# Patient Record
Sex: Female | Born: 1970 | Race: White | Hispanic: No | Marital: Single | Smoking: Former smoker
Health system: Southern US, Community
[De-identification: ages and names within clinical notes are randomized; demographics above are authoritative.]

## PROBLEM LIST (undated history)

## (undated) DIAGNOSIS — E119 Type 2 diabetes mellitus without complications: Secondary | ICD-10-CM

## (undated) DIAGNOSIS — D219 Benign neoplasm of connective and other soft tissue, unspecified: Secondary | ICD-10-CM

## (undated) HISTORY — PX: CHOLECYSTECTOMY: SHX55

---

## 2003-07-02 HISTORY — PX: MYOMECTOMY: SHX85

## 2014-07-21 ENCOUNTER — Other Ambulatory Visit: Payer: Self-pay | Admitting: Obstetrics and Gynecology

## 2014-07-21 DIAGNOSIS — D259 Leiomyoma of uterus, unspecified: Secondary | ICD-10-CM

## 2014-07-27 ENCOUNTER — Ambulatory Visit
Admission: RE | Admit: 2014-07-27 | Discharge: 2014-07-27 | Disposition: A | Discharge status: Home / Self Care | Payer: BLUE CROSS/BLUE SHIELD | Source: Ambulatory Visit | Attending: Obstetrics and Gynecology | Admitting: Obstetrics and Gynecology

## 2014-07-27 DIAGNOSIS — D259 Leiomyoma of uterus, unspecified: Secondary | ICD-10-CM

## 2014-07-27 HISTORY — DX: Type 2 diabetes mellitus without complications: E11.9

## 2014-07-27 HISTORY — PX: IR GENERIC HISTORICAL: IMG1180011

## 2014-07-27 HISTORY — DX: Benign neoplasm of connective and other soft tissue, unspecified: D21.9

## 2014-07-27 NOTE — Consult Note (Signed)
Chief Complaint: Chief Complaint  Patient presents with  . Advice Only    Consult for [place]      Referring Physician(s): Cousins,Sheronette A  History of Present Illness: Lorraine Barnes is a 44 y.o. female with menorrhagia and irregular cycles. The patient has known uterine fibroids and had a previous myomectomy approximately 10 years ago. Following the myomectomy, the menstrual bleeding decreased but this has returned over the past few years. The patient's menstrual bleeding is very irregular and can occur more than once a month. She says that the bleeding usually lasts at least 7 days with 4 very heavy days of bleeding. Patient is mildly anemic. In addition to the heavy menstrual bleeding and irregular bleeding, the patient complains of pelvic pressure and constant urinary frequency and urinary urgency. Patient also complains of some back and abdominal pain. Patient has 2 family members who underwent uterine artery embolization procedure with good results. The patient has discussed hysterectomy with her doctors in the past but does not want to have a hysterectomy at this time due to the recovery time and the fact that she is a single parent. Pregnancy history is G1 P1.  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  Patient is a single parent and works in Therapist, art.  Past Medical History  Diagnosis Date  . Fibroids   . Diabetes mellitus without complication     borderline.  Currently controlled by diet    Past Surgical History  Procedure Laterality Date  . Myomectomy  2005  . Cholecystectomy      Laproscopic  . Cesarean section  2007    Allergies: Review of patient's allergies indicates no known allergies.  Medications: Prior to Admission medications   Medication Sig Start Date End Date Taking? Authorizing Provider  ergocalciferol (VITAMIN D2) 50000 UNITS capsule  Take 50,000 Units by mouth once a week.   Yes Historical Provider, MD  ferrous sulfate 325 (65 FE) MG tablet Take 325 mg by mouth 2 (two) times daily with a meal.   Yes Historical Provider, MD  folic acid (FOLVITE) 1 MG tablet Take 1 mg by mouth daily.   Yes Historical Provider, MD  ibuprofen (ADVIL,MOTRIN) 800 MG tablet Take 800 mg by mouth every 6 (six) hours as needed.   Yes Historical Provider, MD    No family history on file.  History   Social History  . Marital Status: Single    Spouse Name: N/A    Number of Children: N/A  . Years of Education: N/A   Social History Main Topics  . Smoking status: Former Smoker -- 0.25 packs/day    Types: Cigarettes    Start date: 07/28/1987    Quit date: 07/27/1992  . Smokeless tobacco: Never Used  . Alcohol Use: No  . Drug Use: No  . Sexual Activity: None   Other Topics Concern  . None   Social History Narrative  . None    Review of Systems  Constitutional: Negative.   HENT:       Ringing in ears and sinus infections.  Eyes:       Blurred vision  Respiratory: Negative.   Cardiovascular: Negative.   Gastrointestinal: Negative.   Endocrine: Negative.   Genitourinary: Positive for menstrual problem and pelvic pain.       Urinary urgency and frequency  Musculoskeletal: Positive for back pain.  Skin: Negative.   Allergic/Immunologic: Negative.   Neurological: Positive for  light-headedness and headaches.  Psychiatric/Behavioral: Negative.     Vital Signs: BP 125/86 mmHg  Pulse 70  Temp(Src) 98.1 F (36.7 C) (Oral)  Resp 14  Ht 5\' 3"  (1.6 m)  Wt 300 lb (136.079 kg)  BMI 53.16 kg/m2  SpO2 96%  LMP 07/14/2014 (Exact Date)  Physical Exam  Constitutional:  Morbid obesity  Cardiovascular: Normal rate, regular rhythm and normal heart sounds.   Difficult to palpable groin or ankle pulses due to obesity.  Pulmonary/Chest: Effort normal and breath sounds normal.  Abdominal: Soft.    Imaging: Ultrasound from 05/06/2014  demonstrated multiple uterine fibroids. Posterior to the uterus and slightly to the left are 2 simple cystic areas which could represent ovarian cysts. Labs:  CBC: No results for input(s): WBC, HGB, HCT, PLT in the last 8760 hours.  COAGS: No results for input(s): INR, APTT in the last 8760 hours.  BMP: No results for input(s): NA, K, CL, CO2, GLUCOSE, BUN, CALCIUM, CREATININE, GFRNONAA, GFRAA in the last 8760 hours.  Invalid input(s): CMP  LIVER FUNCTION TESTS: No results for input(s): BILITOT, AST, ALT, ALKPHOS, PROT, ALBUMIN in the last 8760 hours.  TUMOR MARKERS: No results for input(s): AFPTM, CEA, CA199, CHROMGRNA in the last 8760 hours.  Assessment and Plan: 44 year old female with menorrhagia and irregular menstrual bleeding. The patient has known fibroids based on ultrasound and previous myomectomy. The patient had temporary relief of her menorrhagia following a myomectomy approximately 10 years ago. The menorrhagia has returned and the patient now has bulk symptoms.   The patient's symptoms are typical for uterine fibroids. We discussed the different treatment options for fibroids, including hysterectomy and uterine artery embolization. I discussed uterine artery embolization procedure in depth including the risks and benefits. The risks included bleeding, infection and vascular injury. I has explained to the patient that she would require a night in the hospital for management of post procedure symptoms. She is very interested in the uterine artery embolization and would like to pursue additional workup to see if she is a candidate.  We will schedule the patient for a pelvic MRI with contrast to further evaluate her fibroid disease. Based on the inter-period bleeding, I think the patient may need an endometrial biopsy. However, the patient has had recent biopsies by her gynecologist and we will ask Dr. Garwin Brothers if the previous biopsies and workup are sufficient or if she needs an  endometrial biopsy as well.    Plan to contact the patient following the MRI results.   Thank you for this interesting consult.  I greatly enjoyed meeting Janera Peugh and look forward to participating in their care.  SignedCarylon Perches 07/27/2014, 5:01 PM   I spent a total of 30 minutes face to face in clinical consultation, greater than 50% of which was counseling/coordinating care for uterine fibroids and menorrhagia.

## 2014-07-28 ENCOUNTER — Other Ambulatory Visit: Payer: Self-pay | Admitting: Obstetrics and Gynecology

## 2014-07-28 DIAGNOSIS — D259 Leiomyoma of uterus, unspecified: Secondary | ICD-10-CM

## 2014-08-04 ENCOUNTER — Other Ambulatory Visit: Payer: BLUE CROSS/BLUE SHIELD

## 2014-08-14 ENCOUNTER — Ambulatory Visit
Admission: RE | Admit: 2014-08-14 | Discharge: 2014-08-14 | Disposition: A | Discharge status: Home / Self Care | Payer: BLUE CROSS/BLUE SHIELD | Source: Ambulatory Visit | Attending: Obstetrics and Gynecology | Admitting: Obstetrics and Gynecology

## 2014-08-14 DIAGNOSIS — D259 Leiomyoma of uterus, unspecified: Secondary | ICD-10-CM

## 2014-08-14 MED ORDER — GADOBENATE DIMEGLUMINE 529 MG/ML IV SOLN
20.0000 mL | Freq: Once | INTRAVENOUS | Status: AC | PRN
  Administered 2014-08-14: 20 mL via INTRAVENOUS

## 2014-08-26 ENCOUNTER — Telehealth: Payer: Self-pay | Admitting: Diagnostic Radiology

## 2014-08-26 NOTE — Telephone Encounter (Signed)
I reviewed patient's pelvic MRI and discussed with Dr. Garwin Brothers.  The largest fibroid is intracavitary and likely causing most of patient's symptoms.  This dominant fibroid is not ideal for uterine artery embolization due to risk of chronic discharge after embolization and risk of dislodging from the endometrial wall.  I explained the MRI findings with the patient and she is scheduled to follow-up with Dr. Garwin Brothers to discuss other treatment options.

## 2016-05-01 IMAGING — MR MR PELVIS WO/W CM
7 of 9 series · 33 of 48 positions shown · IV contrast (multihance)
Comparison: None.

ADDENDUM:
This study was reviewed with Dr. Moatshe. In addition to a small amount
of free fluid in the left adnexa, there are 2 simple appearing cysts
which are contiguous with the left ovary measuring 2-3 cm. These may
represent ovarian or paraovarian cyst, but have benign features.
CLINICAL DATA: Menorrhagia and pelvic pain. Fibroids with previous
myomectomy. Preop evaluation for uterine artery embolization.

EXAM:
MRI PELVIS WITHOUT AND WITH CONTRAST
TECHNIQUE: Multiplanar multisequence MR imaging of the pelvis was performed
both before and after administration of intravenous contrast.
CONTRAST:  20mL MULTIHANCE GADOBENATE DIMEGLUMINE 529 MG/ML IV SOLN

[Series 5: T2 · coronal · 6.0mm · 1.41mm/px · 5 of 30 slices shown]
[im 1/30]
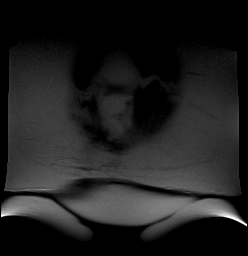
[im 8/30]
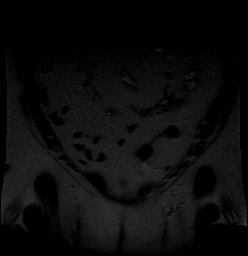
[im 15/30]
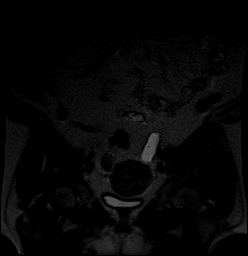
[im 22/30]
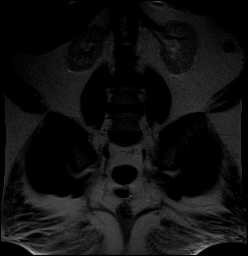
[im 30/30]
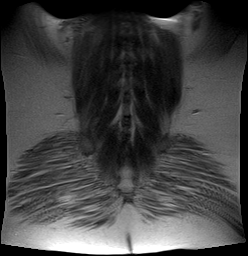

[Series 6: t2_tse_sag · sagittal · 4.0mm · 0.94mm/px · 6 of 37 slices shown]
[im 1/37]
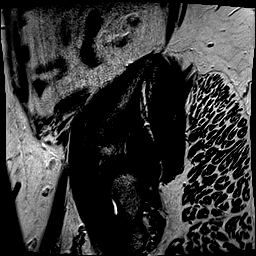
[im 8/37]
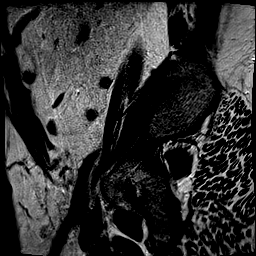
[im 15/37]
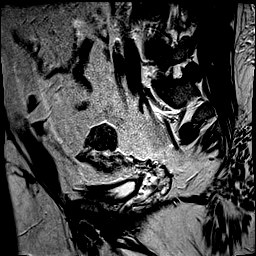
[im 22/37]
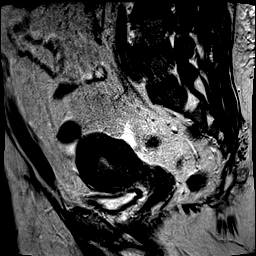
[im 29/37]
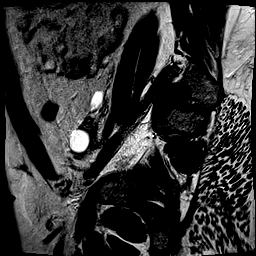
[im 37/37]
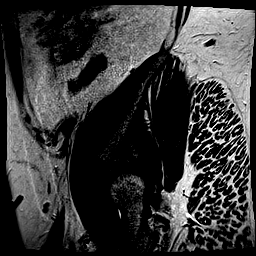

[Series 7: t2_tse axial fs · axial · 5.0mm · 0.94mm/px · z∈[-81,+88]mm · 5 of 28 slices shown]
[im 1/28]
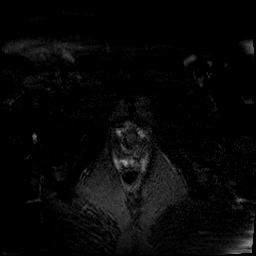
[im 7/28]
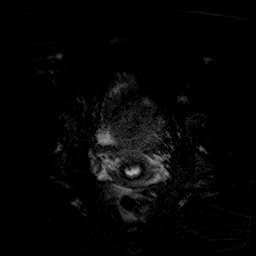
[im 14/28]
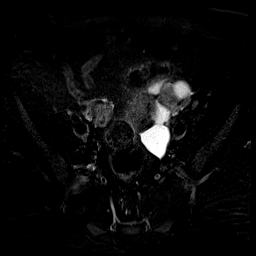
[im 21/28]
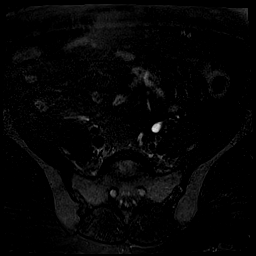
[im 28/28]
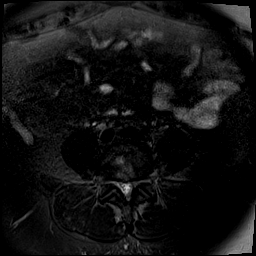

[Series 8: t2_tse axial · axial · 5.0mm · 0.94mm/px · z∈[-81,+88]mm · 5 of 28 slices shown]
[im 1/28]
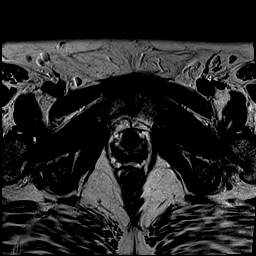
[im 7/28]
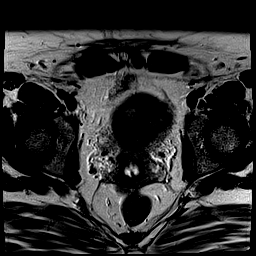
[im 14/28]
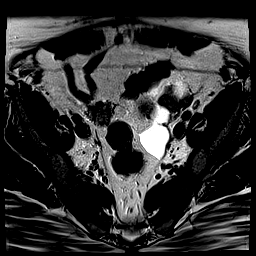
[im 21/28]
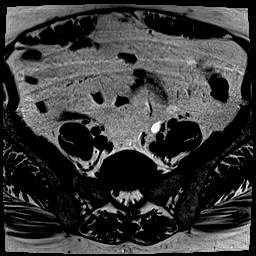
[im 28/28]
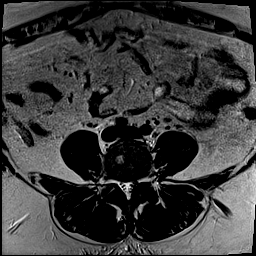

[Series 9: axial spgr · axial · 5.0mm · 0.47mm/px · z∈[-81,+88]mm · 5 of 28 slices shown]
[im 1/28]
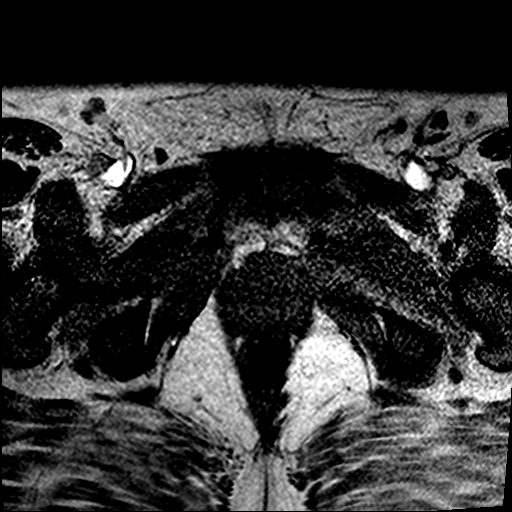
[im 7/28]
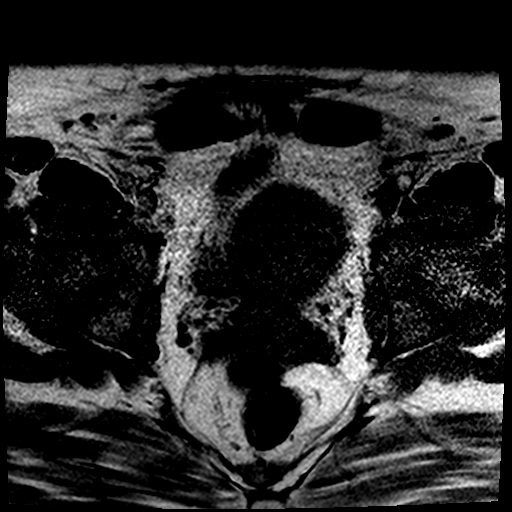
[im 14/28]
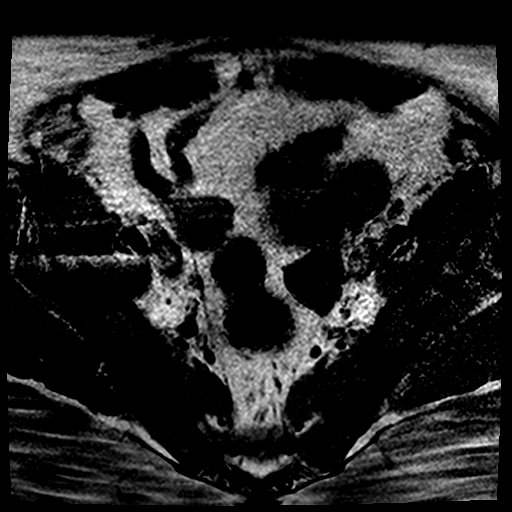
[im 21/28]
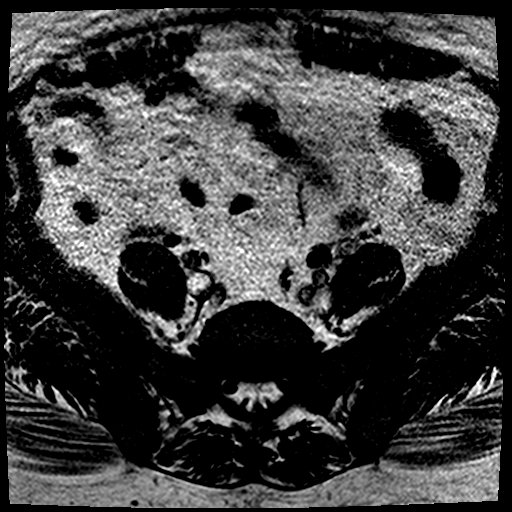
[im 28/28]
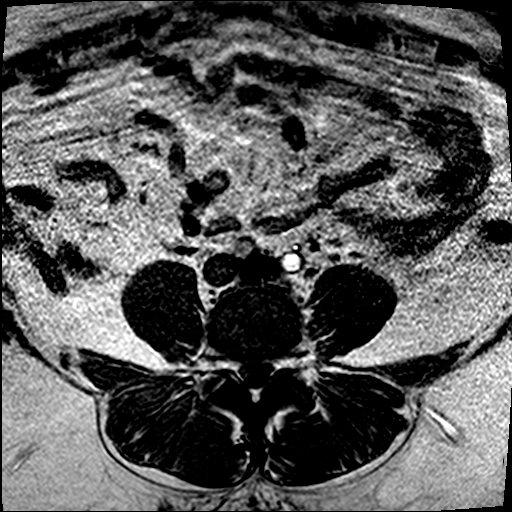

[Series 12: axial spgr post · axial · 5.0mm · 0.47mm/px · z∈[-81,+88]mm · 5 of 28 slices shown (1 of 2)]
[im 1/28]
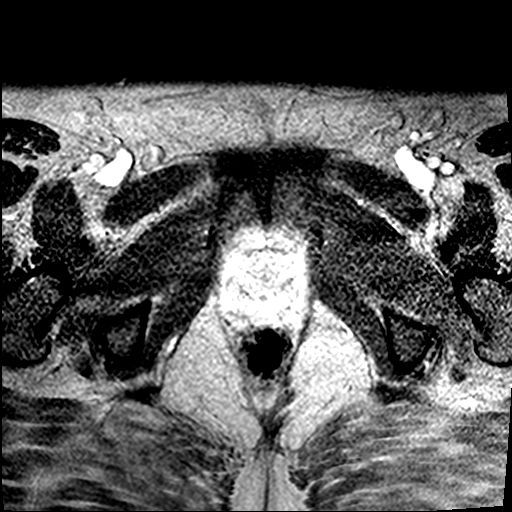
[im 7/28]
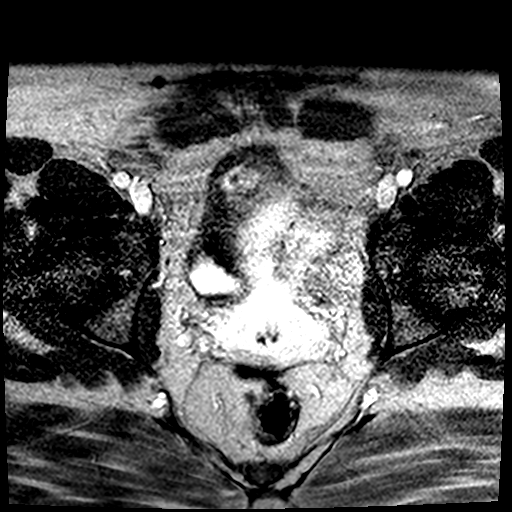
[im 14/28]
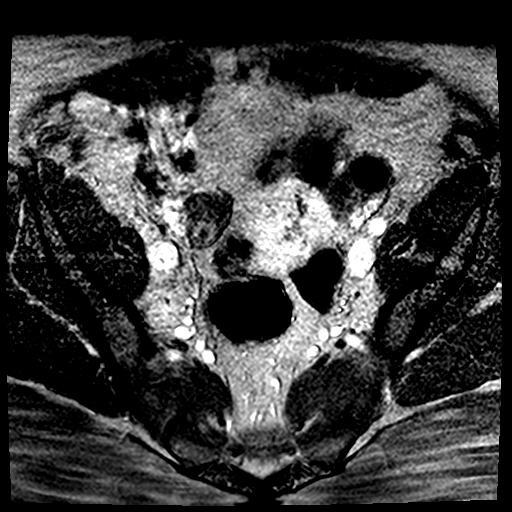
[im 21/28]
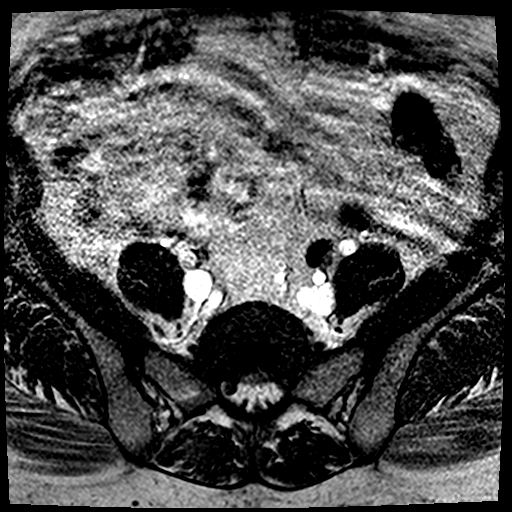
[im 28/28]
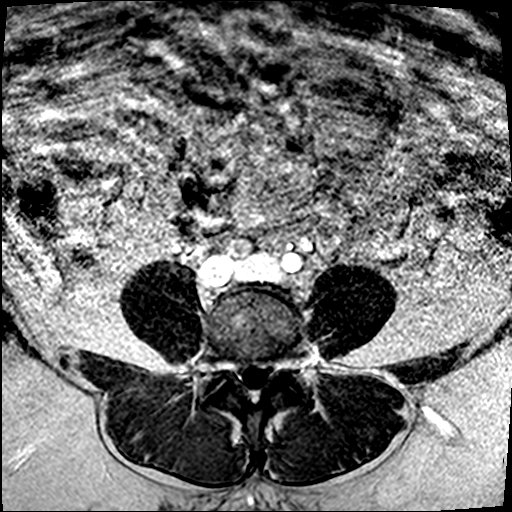

[Series 13: axial spgr post · axial · 5.0mm · 0.47mm/px · z∈[-81,-44]mm · 2 of 28 slices shown (2 of 2)]
[im 1/28]
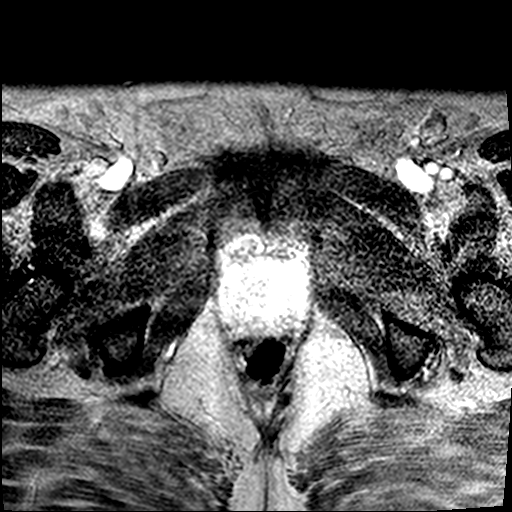
[im 7/28]
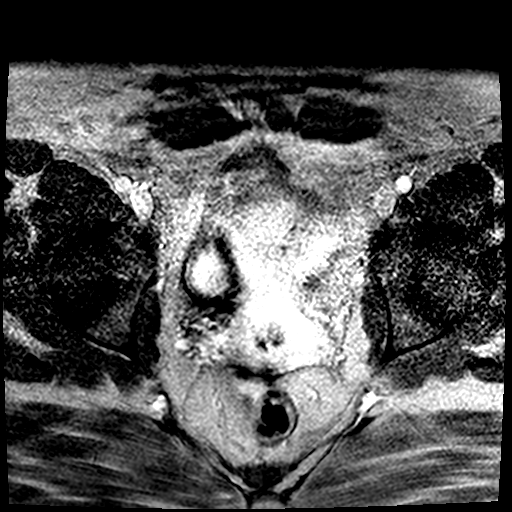

[33 of 48 positions shown; findings below may reference images not displayed]

FINDINGS: Uterus:  Measures 10.2 x 5.3 x 6.2 cm.   Estimated volume = 175  cc

Fibroids: A subserosal fibroid is seen in the left uterine fundus
which measures 2.5 cm in maximum diameter. Another subserosal
fibroid is seen in the right posterior corpus measuring 2.1 cm.

Intracavitary fibroids: An intracavitary fibroid is seen in the
central and inferior portion of the endometrial cavity arising from
the right lateral uterine wall which measures 3.8 x 2.0 cm.

Pedunculated fibroids: None.

Fibroid contrast enhancement: All fibroids show diffuse contrast
enhancement. A tiny area central cystic degeneration is seen in the
left fundal subserosal fibroid.

Cervix:  Normal appearance.

Right ovary:  Normal appearance.  No adnexal mass identified.

Left ovary:  Normal appearance.  No adnexal mass identified.

Free fluid:  Small amount of free fluid noted in left adnexa.

Other: No other pelvic masses, lymphadenopathy, or inflammatory
process identified.
IMPRESSION: Several small uterine fibroids, largest of which is intracavitary
and measures 3.8 cm.

Normal appearance of both ovaries.  No adnexal mass identified.

## 2016-07-03 ENCOUNTER — Encounter: Payer: Self-pay | Admitting: Diagnostic Radiology
